# Patient Record
Sex: Female | Born: 1990 | Race: White | Hispanic: No | Marital: Married | Smoking: Never smoker
Health system: Southern US, Community
[De-identification: ages and names within clinical notes are randomized; demographics above are authoritative.]

---
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
## 2018-09-27 ENCOUNTER — Emergency Department: Payer: BLUE CROSS/BLUE SHIELD

## 2018-09-27 ENCOUNTER — Emergency Department
Admission: EM | Admit: 2018-09-27 | Discharge: 2018-09-27 | Disposition: A | Discharge status: Home / Self Care | Payer: BLUE CROSS/BLUE SHIELD | Attending: Emergency Medicine | Admitting: Emergency Medicine

## 2018-09-27 ENCOUNTER — Encounter: Payer: Self-pay | Admitting: Emergency Medicine

## 2018-09-27 ENCOUNTER — Other Ambulatory Visit: Payer: Self-pay

## 2018-09-27 DIAGNOSIS — S71151A Open bite, right thigh, initial encounter: Secondary | ICD-10-CM | POA: Insufficient documentation

## 2018-09-27 DIAGNOSIS — W540XXA Bitten by dog, initial encounter: Secondary | ICD-10-CM | POA: Insufficient documentation

## 2018-09-27 DIAGNOSIS — Y998 Other external cause status: Secondary | ICD-10-CM | POA: Insufficient documentation

## 2018-09-27 DIAGNOSIS — Y92009 Unspecified place in unspecified non-institutional (private) residence as the place of occurrence of the external cause: Secondary | ICD-10-CM | POA: Diagnosis not present

## 2018-09-27 DIAGNOSIS — Y9389 Activity, other specified: Secondary | ICD-10-CM | POA: Diagnosis not present

## 2018-09-27 LAB — COMPREHENSIVE METABOLIC PANEL
ALBUMIN: 4.3 g/dL (ref 3.5–5.0)
ALK PHOS: 38 U/L (ref 38–126)
ALT: 15 U/L (ref 0–44)
AST: 23 U/L (ref 15–41)
Anion gap: 8 (ref 5–15)
BILIRUBIN TOTAL: 0.4 mg/dL (ref 0.3–1.2)
BUN: 8 mg/dL (ref 6–20)
CALCIUM: 8.3 mg/dL — AB (ref 8.9–10.3)
CO2: 25 mmol/L (ref 22–32)
Chloride: 107 mmol/L (ref 98–111)
Creatinine, Ser: 0.57 mg/dL (ref 0.44–1.00)
GFR calc Af Amer: >60 mL/min (ref >60)
GFR calc non Af Amer: >60 mL/min (ref >60)
GLUCOSE: 87 mg/dL (ref 70–99)
Potassium: 3.5 mmol/L (ref 3.5–5.1)
SODIUM: 140 mmol/L (ref 135–145)
TOTAL PROTEIN: 6.4 g/dL — AB (ref 6.5–8.1)

## 2018-09-27 LAB — CBC
HEMATOCRIT: 38.7 % (ref 36.0–46.0)
HEMOGLOBIN: 13.1 g/dL (ref 12.0–15.0)
MCH: 32.3 pg (ref 26.0–34.0)
MCHC: 33.9 g/dL (ref 30.0–36.0)
MCV: 95.6 fL (ref 80.0–100.0)
Platelets: 166 10*3/uL (ref 150–400)
RBC: 4.05 MIL/uL (ref 3.87–5.11)
RDW: 12.5 % (ref 11.5–15.5)
WBC: 4.4 10*3/uL (ref 4.0–10.5)
nRBC: 0 % (ref 0.0–0.2)

## 2018-09-27 MED ORDER — PIPERACILLIN-TAZOBACTAM 3.375 G IVPB 30 MIN
3.3750 g | Freq: Once | INTRAVENOUS | Status: AC
  Administered 2018-09-27: 3.375 g via INTRAVENOUS
  Filled 2018-09-27: qty 50

## 2018-09-27 MED ORDER — AMOXICILLIN-POT CLAVULANATE 875-125 MG PO TABS: 1.0000 | ORAL_TABLET | Freq: Two times a day (BID) | ORAL | 0 refills | Status: AC

## 2018-09-27 MED ORDER — SODIUM CHLORIDE 0.9 % IV BOLUS
1000.0000 mL | Freq: Once | INTRAVENOUS | Status: AC
  Administered 2018-09-27: 1000 mL via INTRAVENOUS

## 2018-09-27 NOTE — ED Triage Notes (Signed)
Pt reports she was with friend last night, when friends dog(Mastiff) bit the right thigh. Pt has swelling, bruising and 1 puncture wound to area.

## 2018-09-27 NOTE — ED Provider Notes (Signed)
[facility] Emergency Department Provider Note   First MD Initiated Contact with Patient 09/27/18 [phone]     (approximate)  I have reviewed the triage vital signs and the nursing notes.   HISTORY  Chief Complaint Animal Bite    HPI Cassandra Bowers is a 28 y.o. female presents to the emergency department with history of dog bite to the right lateral thigh which patient sustained yesterday.  Dog is the pet of the patient's girlfriend and is up-to-date with vaccines per patient and her girlfriend.        Past medical history None There are no active problems to display for this patient.   History reviewed. No pertinent surgical history.  Prior to Admission medications   Medication Sig Start Date End Date Taking? Authorizing Provider  amoxicillin-clavulanate (AUGMENTIN) 875-125 MG tablet Take 1 tablet by mouth 2 (two) times daily for 10 days. 09/27/18 10/07/18  Darci Current, MD    Allergies Patient has no known allergies.  History reviewed. No pertinent family history.  Social History Social History   Tobacco Use  . Smoking status: Never Smoker  . Smokeless tobacco: Never Used  Substance Use Topics  . Alcohol use: Not on file  . Drug use: Not on file    Review of Systems Constitutional: No fever/chills Eyes: No visual changes. ENT: No sore throat. Cardiovascular: Denies chest pain. Respiratory: Denies shortness of breath. Gastrointestinal: No abdominal pain.  No nausea, no vomiting.  No diarrhea.  No constipation. Genitourinary: Negative for dysuria. Musculoskeletal: Negative for neck pain.  Negative for back pain. Integumentary: Negative for rash.  Positive for right thigh dog bite Neurological: Negative for headaches, focal weakness or numbness.  ____________________________________________   PHYSICAL EXAM:  VITAL SIGNS: ED Triage Vitals [09/27/18 0511]  Enc Vitals Group     BP (!) 140/99     Pulse Rate 100     Resp 18    Temp 99.2 F (37.3 C)     Temp Source Oral     SpO2 100 %     Weight      Height      Head Circumference      Peak Flow      Pain Score      Pain Loc      Pain Edu?      Excl. in GC?     Constitutional: Alert and oriented. Well appearing and in no acute distress. Eyes: Conjunctivae are normal. Mouth/Throat: Mucous membranes are moist.  Oropharynx non-erythematous. Neck: No stridor.   Cardiovascular: Normal rate, regular rhythm. Good peripheral circulation. Grossly normal heart sounds. Respiratory: Normal respiratory effort.  No retractions. Lungs CTAB. Gastrointestinal: Soft and nontender. No distention.  Musculoskeletal: No lower extremity tenderness nor edema. No gross deformities of extremities. Neurologic:  Normal speech and language. No gross focal neurologic deficits are appreciated.  Skin: Ecchymoses right lateral thigh with single puncture wound noted. Psychiatric: Mood and affect are normal. Speech and behavior are normal.  ____________________________________________   LABS (all labs ordered are listed, but only abnormal results are displayed)  Labs Reviewed  COMPREHENSIVE METABOLIC PANEL - Abnormal; Notable for the following components:      Result Value   Calcium 8.3 (*)    Total Protein 6.4 (*)    All other components within normal limits  CBC     RADIOLOGY I,  N Chin Wachter, personally viewed and evaluated these images (plain radiographs) as part of my medical decision making, as well  as reviewing the written report by the radiologist.  ED MD interpretation: Negative right femur x-ray soft tissue unremarkable per radiologist.  Official radiology report(s): Dg Femur Min 2 Views Right  Result Date: 09/27/2018 CLINICAL DATA:  Dog bite to the right thigh. EXAM: RIGHT FEMUR 2 VIEWS COMPARISON:  None. FINDINGS: There is no evidence of fracture or other focal bone lesions. Soft tissues are unremarkable. IMPRESSION: Negative. Electronically Signed   By:  Marnee Spring M.D.   On: 09/27/2018 06:57    Procedures   ____________________________________________   INITIAL IMPRESSION / MDM / ASSESSMENT AND PLAN / ED COURSE  As part of my medical decision making, I reviewed the following data within the electronic MEDICAL RECORD NUMBER  28 year old female presenting with above-stated history and physical exam secondary to dog bite to the right thigh.  Patient will be prescribed Augmentin for home.  ________________  FINAL CLINICAL IMPRESSION(S) / ED DIAGNOSES  Final diagnoses:  Dog bite, initial encounter     MEDICATIONS GIVEN DURING THIS VISIT:  Medications  sodium chloride 0.9 % bolus 1,000 mL (1,000 mLs Intravenous New Bag/Given 09/27/18 0547)  piperacillin-tazobactam (ZOSYN) IVPB 3.375 g (0 g Intravenous Stopped 09/27/18 0631)     ED Discharge Orders         Ordered    amoxicillin-clavulanate (AUGMENTIN) 875-125 MG tablet  2 times daily     09/27/18 0641           Note:  This document was prepared using Dragon voice recognition software and may include unintentional dictation errors.   Darci Current, MD 09/27/18 [phone]

## 2019-02-22 ENCOUNTER — Other Ambulatory Visit: Payer: Self-pay

## 2019-02-22 DIAGNOSIS — Z20822 Contact with and (suspected) exposure to covid-19: Secondary | ICD-10-CM

## 2019-02-23 LAB — NOVEL CORONAVIRUS, NAA: SARS-CoV-2, NAA: NOT DETECTED

## 2019-07-02 ENCOUNTER — Ambulatory Visit: Payer: BC Managed Care – PPO | Attending: Internal Medicine

## 2019-07-02 DIAGNOSIS — Z20822 Contact with and (suspected) exposure to covid-19: Secondary | ICD-10-CM

## 2019-07-03 LAB — NOVEL CORONAVIRUS, NAA: SARS-CoV-2, NAA: NOT DETECTED

## 2019-07-07 ENCOUNTER — Ambulatory Visit: Payer: BC Managed Care – PPO | Attending: Internal Medicine

## 2019-07-07 DIAGNOSIS — Z20822 Contact with and (suspected) exposure to covid-19: Secondary | ICD-10-CM

## 2019-07-09 ENCOUNTER — Other Ambulatory Visit: Payer: BC Managed Care – PPO

## 2019-07-09 LAB — NOVEL CORONAVIRUS, NAA: SARS-CoV-2, NAA: NOT DETECTED

## 2019-07-14 ENCOUNTER — Ambulatory Visit: Payer: BC Managed Care – PPO | Attending: Internal Medicine

## 2019-07-14 DIAGNOSIS — Z20822 Contact with and (suspected) exposure to covid-19: Secondary | ICD-10-CM

## 2019-07-15 LAB — NOVEL CORONAVIRUS, NAA: SARS-CoV-2, NAA: NOT DETECTED

## 2019-07-17 ENCOUNTER — Ambulatory Visit: Payer: BC Managed Care – PPO | Attending: Internal Medicine

## 2019-07-17 DIAGNOSIS — Z20822 Contact with and (suspected) exposure to covid-19: Secondary | ICD-10-CM

## 2019-07-19 LAB — SPECIMEN STATUS REPORT

## 2019-07-19 LAB — NOVEL CORONAVIRUS, NAA: SARS-CoV-2, NAA: NOT DETECTED

## 2019-07-21 ENCOUNTER — Ambulatory Visit: Payer: BC Managed Care – PPO | Attending: Internal Medicine

## 2019-07-21 DIAGNOSIS — Z20822 Contact with and (suspected) exposure to covid-19: Secondary | ICD-10-CM

## 2019-07-22 LAB — NOVEL CORONAVIRUS, NAA: SARS-CoV-2, NAA: NOT DETECTED

## 2020-08-20 IMAGING — CR RIGHT FEMUR 2 VIEWS
1 series · 4 of 4 positions shown · non-contrast
Comparison: None.

CLINICAL DATA: Dog bite to the right thigh.

EXAM:
RIGHT FEMUR 2 VIEWS

[Series 1: dg femur, min 2 views right · 0.14mm/px · 4 of 4 slices shown]
[im 1/4]
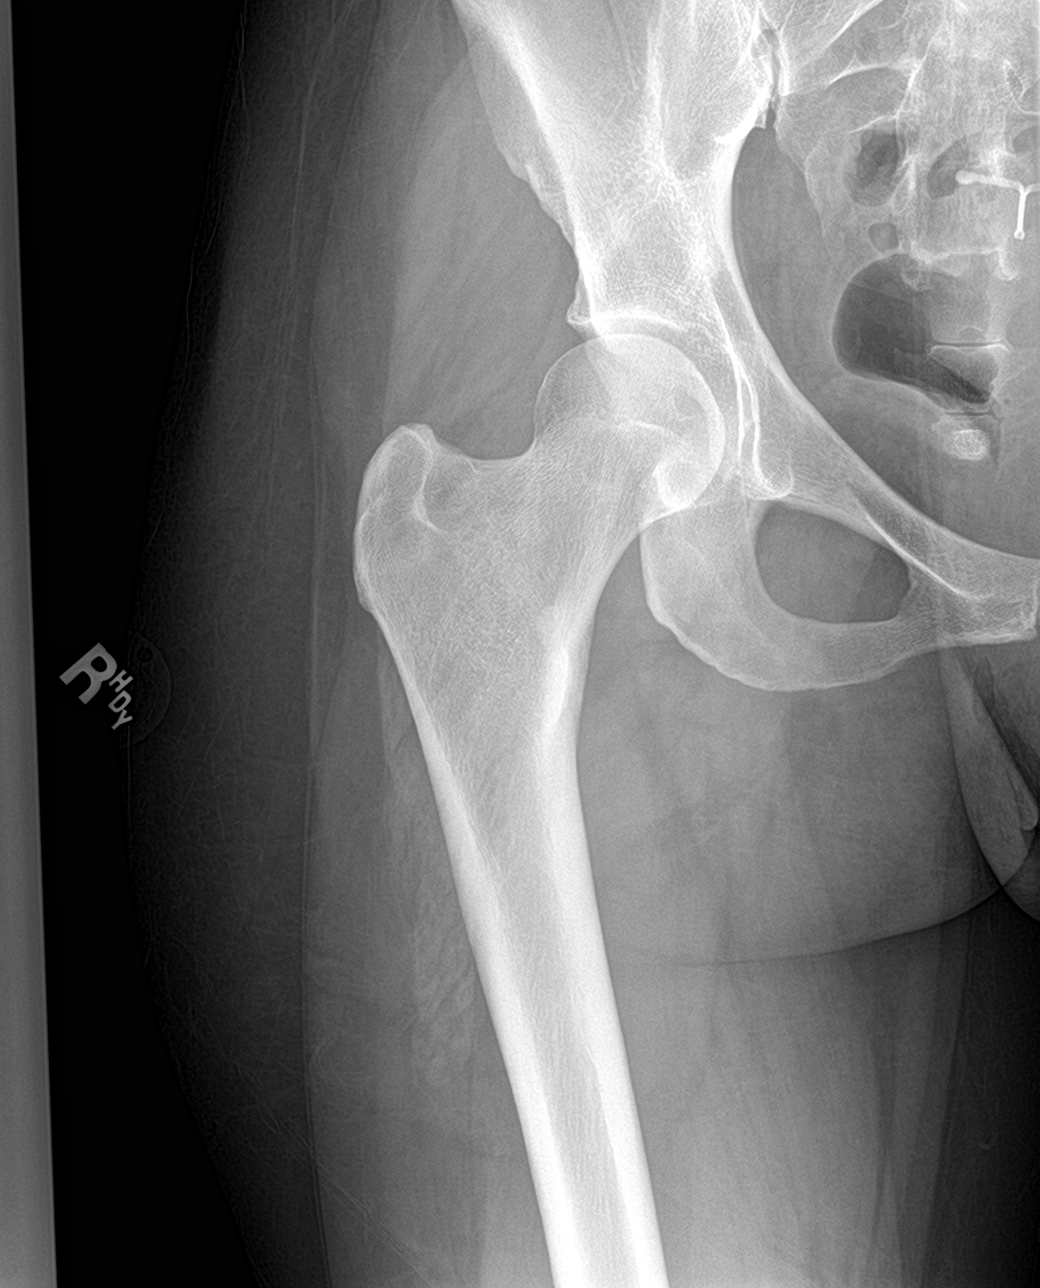
[im 2/4]
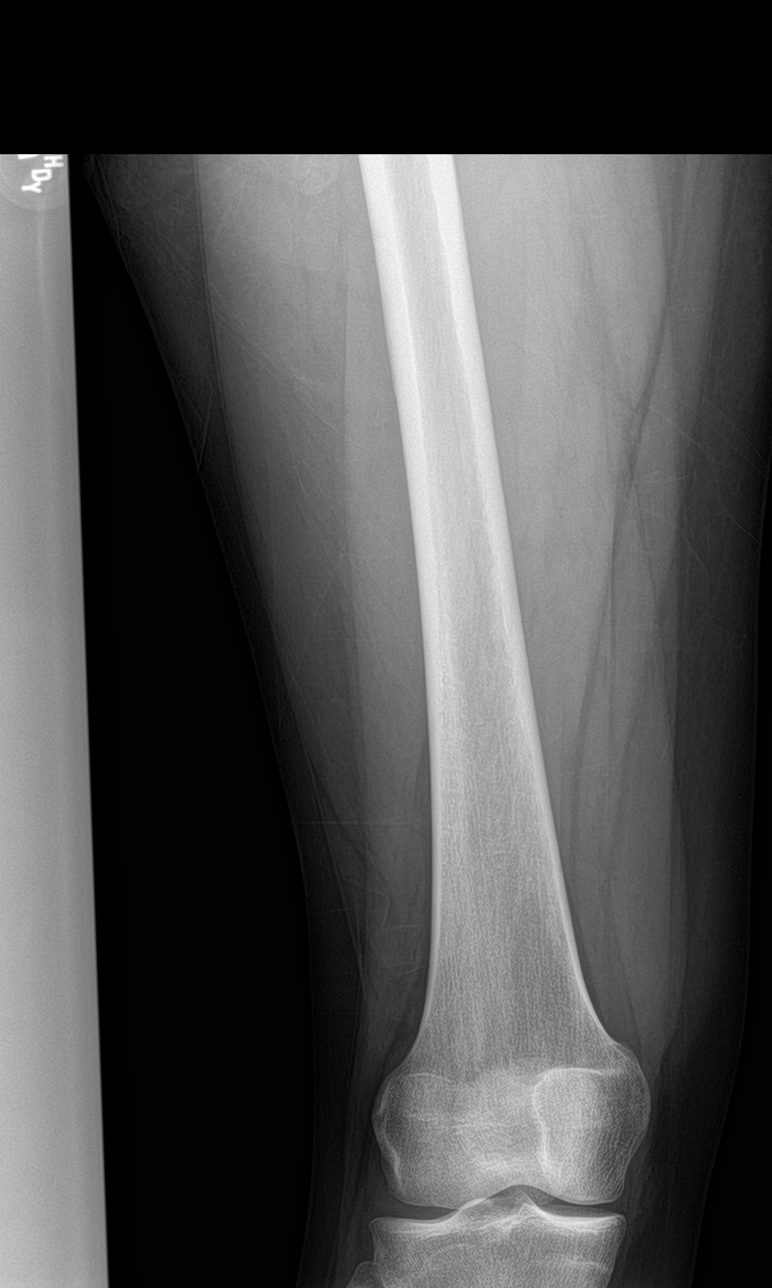
[im 3/4]
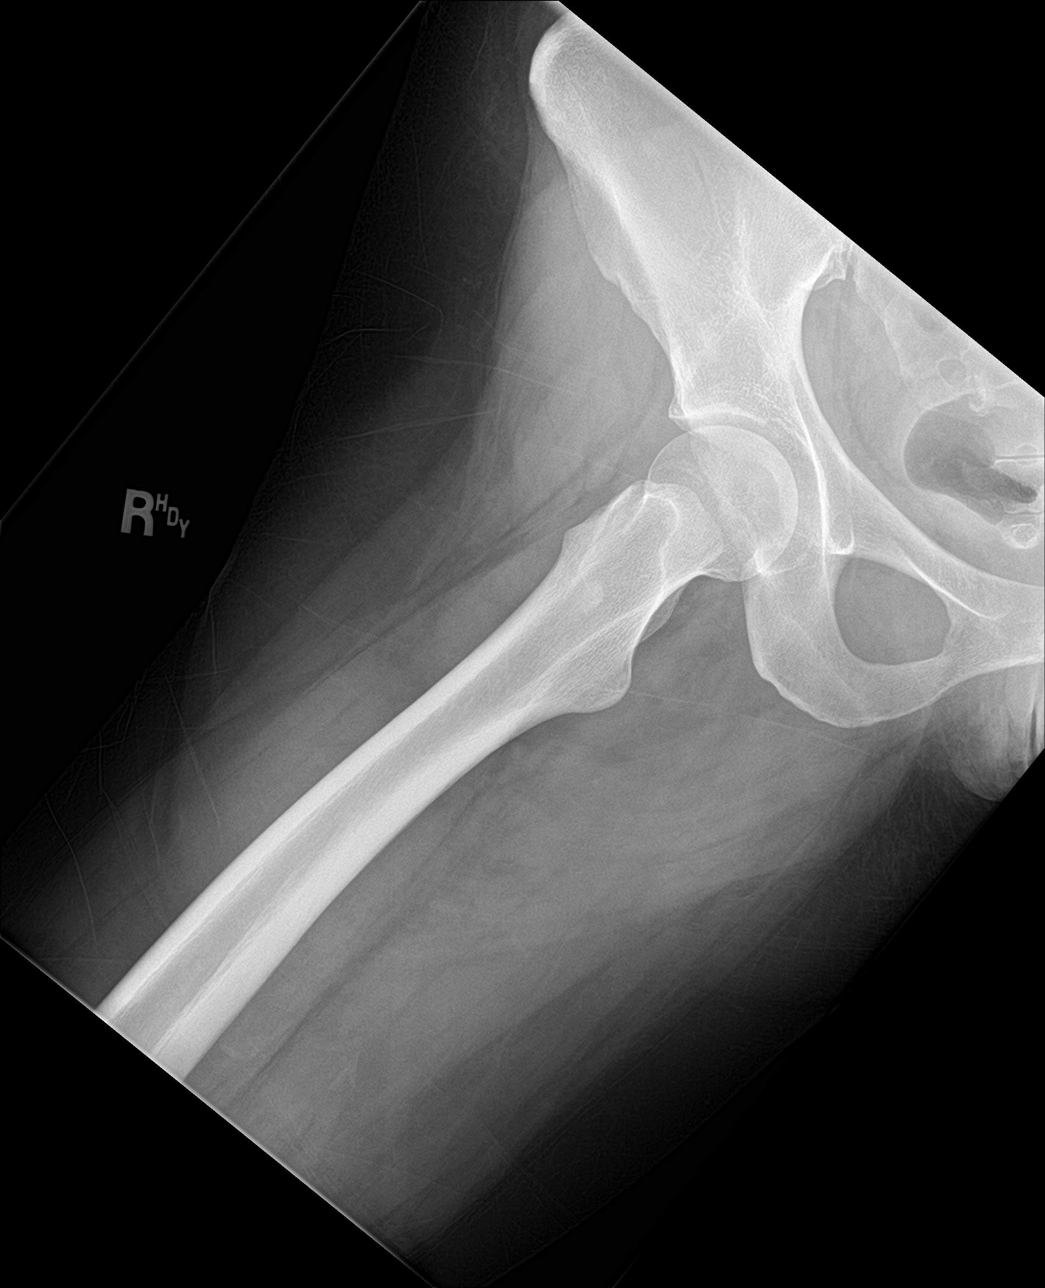
[im 4/4]
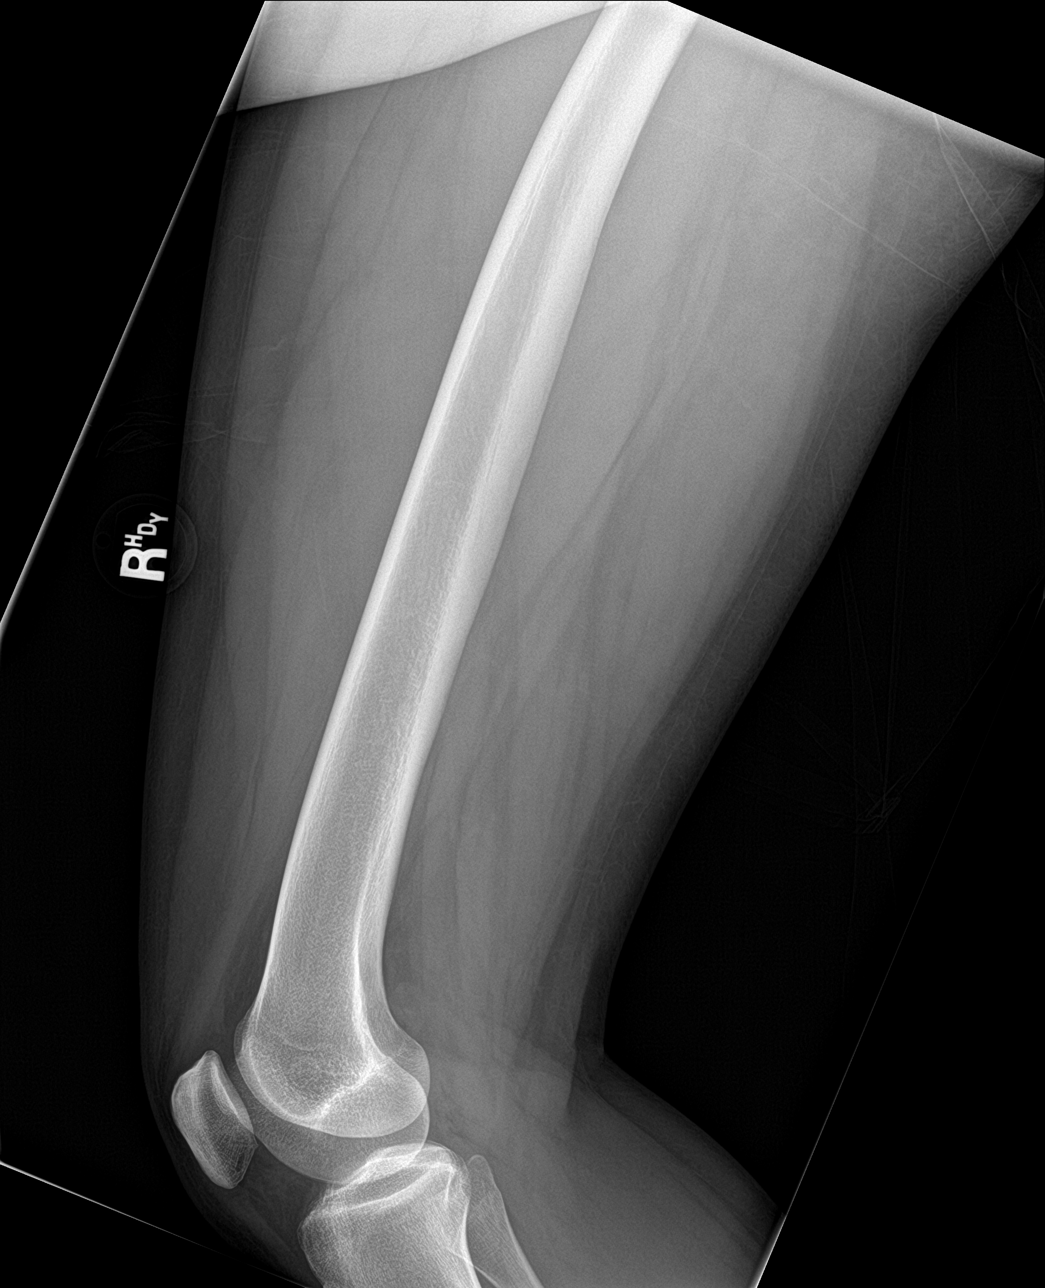

[4 of 4 positions shown; findings below may reference images not displayed]

FINDINGS: There is no evidence of fracture or other focal bone lesions. Soft
tissues are unremarkable.
IMPRESSION: Negative.
